# Patient Record
Sex: Female | Born: 1975 | Race: Black or African American | Hispanic: No | Marital: Single | Smoking: Never smoker
Health system: Southern US, Community
[De-identification: ages and names within clinical notes are randomized; demographics above are authoritative.]

## PROBLEM LIST (undated history)

## (undated) DIAGNOSIS — I10 Essential (primary) hypertension: Secondary | ICD-10-CM

## (undated) HISTORY — PX: ABDOMINAL HYSTERECTOMY: SHX81

---

## 2017-06-29 ENCOUNTER — Emergency Department (HOSPITAL_COMMUNITY)
Admission: EM | Admit: 2017-06-29 | Discharge: 2017-06-29 | Disposition: A | Discharge status: Home / Self Care | Payer: 59 | Attending: Emergency Medicine | Admitting: Emergency Medicine

## 2017-06-29 ENCOUNTER — Encounter (HOSPITAL_COMMUNITY): Payer: Self-pay | Admitting: Emergency Medicine

## 2017-06-29 ENCOUNTER — Other Ambulatory Visit: Payer: Self-pay

## 2017-06-29 DIAGNOSIS — M79651 Pain in right thigh: Secondary | ICD-10-CM | POA: Insufficient documentation

## 2017-06-29 DIAGNOSIS — M79604 Pain in right leg: Secondary | ICD-10-CM

## 2017-06-29 HISTORY — DX: Essential (primary) hypertension: I10

## 2017-06-29 MED ORDER — HYDROCODONE-ACETAMINOPHEN 5-325 MG PO TABS: 2.0000 | ORAL_TABLET | ORAL | 0 refills | Status: DC | PRN

## 2017-06-29 MED ORDER — DIAZEPAM 5 MG PO TABS
5.0000 mg | ORAL_TABLET | Freq: Once | ORAL | Status: AC
  Administered 2017-06-29: 5 mg via ORAL
  Filled 2017-06-29: qty 1

## 2017-06-29 MED ORDER — MORPHINE SULFATE (PF) 4 MG/ML IV SOLN
4.0000 mg | Freq: Once | INTRAVENOUS | Status: AC
  Administered 2017-06-29: 4 mg via INTRAMUSCULAR
  Filled 2017-06-29: qty 1

## 2017-06-29 MED ORDER — METAXALONE 800 MG PO TABS: 800.0000 mg | ORAL_TABLET | Freq: Three times a day (TID) | ORAL | 0 refills | Status: AC

## 2017-06-29 MED ORDER — IBUPROFEN 400 MG PO TABS: 400.0000 mg | ORAL_TABLET | Freq: Four times a day (QID) | ORAL | 0 refills | Status: DC | PRN

## 2017-06-29 MED ORDER — KETOROLAC TROMETHAMINE 60 MG/2ML IM SOLN
60.0000 mg | Freq: Once | INTRAMUSCULAR | Status: AC
  Administered 2017-06-29: 60 mg via INTRAMUSCULAR
  Filled 2017-06-29: qty 2

## 2017-06-29 NOTE — ED Notes (Signed)
Pt pacing in room and is tearful due to pain  Acuity increased due to pain level

## 2017-06-29 NOTE — ED Triage Notes (Signed)
Pt is c/o pain and numbness to her right upper leg States the pain goes up into her back  Pt states her leg keeps jumping  Denies any injury

## 2017-06-29 NOTE — ED Provider Notes (Signed)
[facility]-EMERGENCY DEPT Provider Note   CSN: [id number] Arrival date & time: 06/29/17  0442     History   Chief Complaint Chief Complaint  Patient presents with  . Leg Pain    HPI Nicole Powell is a 42 y.o. female.  42 year old female presents with 1 day history of right groin pain that started in back and radiates to her right lateral thigh.  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  States that the pain is sharp and worse with certain movements.  Denies any distal numbness or tingling to her right foot.  Patient denies any direct trauma.  Symptoms better with rest and no treatment used prior to arrival.  No prior history of DVT.      Past Medical History:  Diagnosis Date  . Hypertension     There are no active problems to display for this patient.   Past Surgical History:  Procedure Laterality Date  . ABDOMINAL HYSTERECTOMY      OB History    No data available       Home Medications    Prior to Admission medications   Medication Sig Start Date End Date Taking? Authorizing Provider  amLODipine (NORVASC) 5 MG tablet Take 5 mg by mouth daily.   Yes [provider]    Family History Family History  Problem Relation Age of Onset  . Diabetes Mother   . Hypertension Father     Social History Social History   Tobacco Use  . Smoking status: Never Smoker  . Smokeless tobacco: Never Used  Substance Use Topics  . Alcohol use: No    Frequency: Never  . Drug use: No     Allergies   Patient has no known allergies.   Review of Systems Review of Systems  All other systems reviewed and are negative.    Physical Exam Updated Vital Signs BP (!) 132/102 (BP Location: Left Arm)   Pulse 75   Temp 97.9 F (36.6 C) (Oral)   Resp 14   SpO2 94%   Physical Exam  Constitutional: She is oriented to person, place, and time. She appears well-developed and well-nourished.  Non-toxic appearance.  No distress.  HENT:  Head: Normocephalic and atraumatic.  Eyes: Conjunctivae, EOM and lids are normal. Pupils are equal, round, and reactive to light.  Neck: Normal range of motion. Neck supple. No tracheal deviation present. No thyroid mass present.  Cardiovascular: Normal rate, regular rhythm and normal heart sounds. Exam reveals no gallop.  No murmur heard. Pulmonary/Chest: Effort normal and breath sounds normal. No stridor. No respiratory distress. She has no decreased breath sounds. She has no wheezes. She has no rhonchi. She has no rales.  Abdominal: Soft. Normal appearance and bowel sounds are normal. She exhibits no distension. There is no tenderness. There is no rebound and no CVA tenderness.  Musculoskeletal: Normal range of motion. She exhibits no edema or tenderness.       Right hip: She exhibits normal strength, no swelling and no deformity.       Legs: Neurological: She is alert and oriented to person, place, and time. She has normal strength. No cranial nerve deficit or sensory deficit. GCS eye subscore is 4. GCS verbal subscore is 5. GCS motor subscore is 6.  Skin: Skin is warm and dry. No abrasion and no rash noted.  Psychiatric: She has a normal mood and affect. Her speech is normal and behavior is normal.  Nursing note and vitals reviewed.    ED Treatments / Results  Labs (all labs ordered are listed, but only abnormal results are displayed) Labs Reviewed - No data to display  EKG  EKG Interpretation None       Radiology No results found.  Procedures Procedures (including critical care time)  Medications Ordered in ED Medications  ketorolac (TORADOL) injection 60 mg (not administered)  diazepam (VALIUM) tablet 5 mg (not administered)  morphine 4 MG/ML injection 4 mg (not administered)     Initial Impression / Assessment and Plan / ED Course  I have reviewed the triage vital signs and the nursing notes.  Pertinent labs & imaging results that were  available during my care of the patient were reviewed by me and considered in my medical decision making (see chart for details).     Patient treated for pain here and feels better.  Suspect musculoskeletal etiology of her symptoms.  Low concern for DVT or compartment syndrome.  Stable for discharge  Final Clinical Impressions(s) / ED Diagnoses   Final diagnoses:  None    ED Discharge Orders    None       Lorre NickAllen, Kaeli Nichelson, MD 06/29/17 [phone]

## 2017-07-01 ENCOUNTER — Encounter (HOSPITAL_COMMUNITY): Payer: Self-pay

## 2017-07-01 ENCOUNTER — Emergency Department (HOSPITAL_COMMUNITY)
Admission: EM | Admit: 2017-07-01 | Discharge: 2017-07-01 | Disposition: A | Discharge status: Home / Self Care | Payer: 59 | Attending: Emergency Medicine | Admitting: Emergency Medicine

## 2017-07-01 ENCOUNTER — Other Ambulatory Visit: Payer: Self-pay

## 2017-07-01 ENCOUNTER — Emergency Department (HOSPITAL_BASED_OUTPATIENT_CLINIC_OR_DEPARTMENT_OTHER)
Admit: 2017-07-01 | Discharge: 2017-07-01 | Disposition: A | Discharge status: Home / Self Care | Payer: 59 | Attending: Emergency Medicine | Admitting: Emergency Medicine

## 2017-07-01 DIAGNOSIS — I10 Essential (primary) hypertension: Secondary | ICD-10-CM | POA: Diagnosis not present

## 2017-07-01 DIAGNOSIS — M79609 Pain in unspecified limb: Secondary | ICD-10-CM

## 2017-07-01 DIAGNOSIS — M79604 Pain in right leg: Secondary | ICD-10-CM | POA: Insufficient documentation

## 2017-07-01 DIAGNOSIS — Z79899 Other long term (current) drug therapy: Secondary | ICD-10-CM | POA: Insufficient documentation

## 2017-07-01 DIAGNOSIS — R030 Elevated blood-pressure reading, without diagnosis of hypertension: Secondary | ICD-10-CM | POA: Insufficient documentation

## 2017-07-01 MED ORDER — ACETAMINOPHEN 500 MG PO TABS
1000.0000 mg | ORAL_TABLET | Freq: Once | ORAL | Status: AC
  Administered 2017-07-01: 1000 mg via ORAL
  Filled 2017-07-01: qty 2

## 2017-07-01 MED ORDER — KETOROLAC TROMETHAMINE 30 MG/ML IJ SOLN
30.0000 mg | Freq: Once | INTRAMUSCULAR | Status: AC
  Administered 2017-07-01: 30 mg via INTRAMUSCULAR
  Filled 2017-07-01: qty 1

## 2017-07-01 MED ORDER — METHOCARBAMOL 500 MG PO TABS: 500.0000 mg | ORAL_TABLET | Freq: Two times a day (BID) | ORAL | 0 refills | Status: AC

## 2017-07-01 MED ORDER — PREDNISONE 20 MG PO TABS: 40.0000 mg | ORAL_TABLET | Freq: Once | ORAL | Status: DC

## 2017-07-01 MED ORDER — PREDNISONE 20 MG PO TABS: 40.0000 mg | ORAL_TABLET | Freq: Every day | ORAL | 0 refills | Status: AC

## 2017-07-01 NOTE — Progress Notes (Signed)
*  Preliminary Results* Right lower extremity venous duplex completed. Right lower extremity is negative for deep vein thrombosis. There is no evidence of right Baker's cyst.  07/01/2017 10:01 AM  Gertie FeyMichelle Ernesteen Mihalic, BS, RVT, RDCS, RDMS

## 2017-07-01 NOTE — ED Triage Notes (Signed)
Patient c/o right hip pain x 5 days. Patient was seen in the ED 2 days ago and was prescribed Hydrocodone. Patient states the pain is relly no better and her job does not allow her to work if taking Hydrocodone.

## 2017-07-01 NOTE — Discharge Instructions (Signed)
Please see the information and instructions below regarding your visit.  Your diagnoses today include:  1. Pain of right lower extremity   2. Elevated blood pressure reading    About diagnosis. Most episodes of acute low back pain are self-limited. Your exam was reassuring today that the source of your pain is not affecting the spinal cord and nerves that originate in the spinal cord.   If you have a history of disc herniation or arthritis in your spine, the nerves exiting the spine on one side get inflamed. This can cause severe pain. We call this radiculopathy. It can radiate down the leg. We do not always know what causes the sudden inflammation.  Tests performed today include: See side panel of your discharge paperwork for testing performed today. Vital signs are listed at the bottom of these instructions.   The ultrasound of your right lower extremity shows no evidence of blood clot.  Medications prescribed:    Take any prescribed medications only as prescribed, and any over the counter medications only as directed on the packaging.  You are prescribed prednisone, a steroid. This is a medication to help reduce inflammation in the spine.  Common side effects include upset stomach/nausea. You may take this medicine with food if this occurs. Other side effects include restlessness, difficulty sleeping, and increased sweating. Call your healthcare provider if these do not resolve after finishing the medication.  This medicine may increase your blood sugar so additional careful monitoring is needed of blood sugar if you have diabetes. Call your healthcare provider for any signs/symtpoms of high blood sugar such as confusion, feeling sleepy, more thirst, more hunger, passing urine more often, flushing, fast breathing, or breath that smells like fruit.  You are prescribed Robaxin, a muscle relaxant. Some common side effects of this medication include:  Feeling sleepy.  Dizziness. Take care  upon going from a seated to a standing position.  Dry mouth.  Feeling tired or weak.  Hard stools (constipation).  Upset stomach. These are not all of the side effects that may occur. If you have questions about side effects, call your doctor. Call your primary care provider for medical advice about side effects.  This medication can be sedating. Only take this medication as needed. Please do not combine with alcohol. Do not drive or operate machinery while taking this medication.   This medication can interact with some other medications. Make sure to tell any provider you are taking this medication before they prescribe you a new medication.    Home care instructions:   Low back pain gets worse the longer you stay stationary. Please keep moving and walking as tolerated. There are exercises included in this packet to perform as tolerated for your low back pain.   Apply heat to the areas that are painful. Avoid twisting or bending your trunk to lift something. Do not lift anything above 25 lbs while recovering from this flare of low back pain.  Please follow any educational materials contained in this packet.   Follow-up instructions: Please follow-up with your primary care provider as soon as possible for further evaluation of your symptoms if they are not completely improved.   Please follow up with Dr. Everardo PacificVarkey of orthopedic surgery.  Return instructions:  Please return to the Emergency Department if you experience worsening symptoms.  Please return for any fever or chills in the setting of your back pain, weakness in the muscles of the legs, numbness in your legs and feet that  is new or changing, numbness in the area where you wipe, retention of your urine, loss of bowel or bladder control, or problems with walking. Please return for any change in color of your lower extremity, increasing swelling. Please return if you have any other emergent concerns.  Additional  Information:   Your vital signs today were: BP 127/74    Pulse 74    Temp 98 F (36.7 C) (Oral)    Resp 16    Ht 5\' 5"  (1.651 m)    Wt 83.5 kg (184 lb)    SpO2 96%    BMI 30.62 kg/m  If your blood pressure (BP) was elevated on multiple readings during this visit above 130 for the top number or above 80 for the bottom number, please have this repeated by your primary care provider within one month. --------------  Thank you for allowing [place] to participate in your care today.

## 2017-07-01 NOTE — ED Notes (Signed)
Ultrasound at bedside

## 2017-07-01 NOTE — ED Provider Notes (Signed)
[facility]-EMERGENCY DEPT Provider Note   CSN: [id number] Arrival date & time: 07/01/17  0710     History   Chief Complaint Chief Complaint  Patient presents with  . Leg Pain    HPI Nicole Powell is a 42 y.o. female.  HPI  Patient is a 26-year-old female with a past medical history of hypertension presenting for right leg pain.  Patient reports that the pain has been occurring for the past 4 days.  Patient reports that it begins in her right buttocks, radiates down to the anterior thigh, and into her calf.  Patient reports she was evaluated here 2 days ago and prescribed Norco and muscle relaxants, however her employer told her she was unable to work while taking these medications.  Patient reports that she has had minimal relief of the pain.  Patient reports that her distal right lower extremity will occasionally have decreased sensation when her leg is fully flexed.  Patient denies any symptoms on the left lower extremity.  Patient denies any saddle anesthesia, loss of bowel or bladder control, or muscular weakness.  Patient but she has had difficulty picking up the leg due to the pain.  No erythema or edema of right hip.  No color change of the extremity.  Patient denies any history of cancer, IVDU.  No history of immunocompromise status.  No recent immobilization, hospitalization, history of cancer, history of DVT/PE, smoking.   Past Medical History:  Diagnosis Date  . Hypertension     There are no active problems to display for this patient.   Past Surgical History:  Procedure Laterality Date  . ABDOMINAL HYSTERECTOMY      OB History   None      Home Medications    Prior to Admission medications   Medication Sig Start Date End Date Taking? Authorizing Provider  amLODipine (NORVASC) 5 MG tablet Take 5 mg by mouth daily.   Yes [provider]  HYDROcodone-acetaminophen (NORCO/VICODIN) 5-325 MG tablet Take 2 tablets by mouth every 4 (four)  hours as needed. Patient taking differently: Take 2 tablets by mouth every 4 (four) hours as needed for severe pain.  06/29/17  Yes Lorre Nick, MD  ibuprofen (ADVIL,MOTRIN) 400 MG tablet Take 1 tablet (400 mg total) by mouth every 6 (six) hours as needed. Patient taking differently: Take 400 mg by mouth every 6 (six) hours as needed for moderate pain.  06/29/17  Yes Lorre Nick, MD  metaxalone (SKELAXIN) 800 MG tablet Take 1 tablet (800 mg total) by mouth 3 (three) times daily. 06/29/17  Yes Lorre Nick, MD    Family History Family History  Problem Relation Age of Onset  . Diabetes Mother   . Hypertension Father     Social History Social History   Tobacco Use  . Smoking status: Never Smoker  . Smokeless tobacco: Never Used  Substance Use Topics  . Alcohol use: No    Frequency: Never  . Drug use: No     Allergies   Patient has no known allergies.   Review of Systems Review of Systems  Constitutional: Negative for chills and fever.  Gastrointestinal: Negative for abdominal pain.  Genitourinary: Negative for difficulty urinating and dysuria.  Musculoskeletal: Positive for arthralgias, back pain and myalgias.  Skin: Negative for color change.  Neurological: Positive for numbness. Negative for weakness.     Physical Exam Updated Vital Signs BP (!) 150/95 (BP Location: Left Arm)   Pulse 86   Temp 98 F (  36.7 C) (Oral)   Resp 18   Ht 5\' 5"  (1.651 m)   Wt 83.5 kg (184 lb)   SpO2 97%   BMI 30.62 kg/m   Physical Exam  Constitutional: She appears well-developed and well-nourished. No distress.  Sitting comfortably in bed.  HENT:  Head: Normocephalic and atraumatic.  Eyes: Conjunctivae are normal. Right eye exhibits no discharge. Left eye exhibits no discharge.  EOMs normal to gross examination.  Neck: Normal range of motion.  Cardiovascular: Normal rate and regular rhythm.  Intact, 2+ DP and PT pulses bilaterally. No calf tenderness or calf edema.    Pulmonary/Chest:  Normal respiratory effort. Patient converses comfortably. No audible wheeze or stridor.  Abdominal: She exhibits no distension.  Musculoskeletal: Normal range of motion.  No midline tenderness to palpation of lumbar spine.  There is mild right-sided paraspinal muscular tenderness.  Right hip with pain over greater trochanter, but no erythema or edema.  LOWER EXTREMITY EXAM: Right  INSPECTION & PALPATION: No gross deformity.  No swelling.  No open wounds.  TTP of right anterior thigh and lateral leg.  SENSORY: sensation is intact to light and sharp touch in:  Superficial peroneal nerve distribution (over dorsum of foot) Deep peroneal nerve distribution (over first dorsal web space) Sural nerve distribution (over lateral aspect 5th metatarsal) Saphenous nerve distribution (over medial instep)  MOTOR:  4+/5 hip flexion, extension, adduction, abduction (tested in a seated position); patient reporting pain is affecting effort 5/5 leg flexion, extension (tested in a seated position; legs hanging over side of bed) 5/5 great toe dorsiflexion, plantar flexion 5/5 ankle dorsiflexion, plantar flexion  VASCULAR: 2+ dorsalis pedis and posterior tibialis pulses Capillary refill < 2 sec, toes warm and well-perfused  COMPARTMENTS: Soft, warm, well-perfused No pain with passive extension No parethesias   Neurological: She is alert.  Cranial nerves intact to gross observation. Patient moves extremities without difficulty.  Skin: Skin is warm and dry. She is not diaphoretic.  Psychiatric: She has a normal mood and affect. Her behavior is normal. Judgment and thought content normal.  Nursing note and vitals reviewed.    ED Treatments / Results  Labs (all labs ordered are listed, but only abnormal results are displayed) Labs Reviewed - No data to display  EKG  EKG Interpretation None       Radiology No results found.  Procedures Procedures (including  critical care time)  Medications Ordered in ED Medications  ketorolac (TORADOL) 30 MG/ML injection 30 mg (30 mg Intramuscular Given 07/01/17 0852)  acetaminophen (TYLENOL) tablet 1,000 mg (1,000 mg Oral Given 07/01/17 7829)     Initial Impression / Assessment and Plan / ED Course  I have reviewed the triage vital signs and the nursing notes.  Pertinent labs & imaging results that were available during my care of the patient were reviewed by me and considered in my medical decision making (see chart for details).  Clinical Course as of Jul 01 1229  Fri Jul 01, 2017  1031 Negative DVT noticed.    [AM]    Clinical Course User Index [AM] Elisha Ponder, PA-C    Patient is nontoxic-appearing, afebrile, in no acute distress.  Differential diagnosis includes lumbar radiculopathy, trochanteric bursitis, issue bursitis, DVT of the right lower extremity.  Doubt arterial occlusion, as limb is well perfused, and patient has 2+ distal pulses.  No evidence of cauda equina at this time.  Lower extremity venous ultrasound study is negative for DVT.  Suspect radiculopathy, particularly L4 radiculopathy  given the distribution of patient's symptoms.  I discussed safe exercises and lifting practices.  Will prescribe steroid prescription and refill muscle relaxant.  Discussed orthopedic and PCP follow-up.  Patient is in understanding agrees with plan of care.  Of note, patient is hypertensive today.  Patient is taking antihypertensives, but did not take this morning.  Discussed with patient.  Final Clinical Impressions(s) / ED Diagnoses   Final diagnoses:  Pain of right lower extremity  Elevated blood pressure reading    ED Discharge Orders        Ordered    predniSONE (DELTASONE) 20 MG tablet  Daily     07/01/17 1235    methocarbamol (ROBAXIN) 500 MG tablet  2 times daily     07/01/17 1235       Delia ChimesMurray, Nekisha Mcdiarmid B, PA-C 07/01/17 1255    Benjiman CorePickering, Nathan, MD 07/01/17 1708

## 2018-06-26 ENCOUNTER — Emergency Department (HOSPITAL_COMMUNITY)
Admission: EM | Admit: 2018-06-26 | Discharge: 2018-06-26 | Disposition: A | Discharge status: Home / Self Care | Payer: 59 | Attending: Emergency Medicine | Admitting: Emergency Medicine

## 2018-06-26 ENCOUNTER — Encounter (HOSPITAL_COMMUNITY): Payer: Self-pay | Admitting: Emergency Medicine

## 2018-06-26 ENCOUNTER — Other Ambulatory Visit: Payer: Self-pay

## 2018-06-26 DIAGNOSIS — R2241 Localized swelling, mass and lump, right lower limb: Secondary | ICD-10-CM | POA: Insufficient documentation

## 2018-06-26 DIAGNOSIS — M25471 Effusion, right ankle: Secondary | ICD-10-CM

## 2018-06-26 DIAGNOSIS — M25571 Pain in right ankle and joints of right foot: Secondary | ICD-10-CM | POA: Diagnosis present

## 2018-06-26 DIAGNOSIS — F1210 Cannabis abuse, uncomplicated: Secondary | ICD-10-CM | POA: Insufficient documentation

## 2018-06-26 DIAGNOSIS — F17200 Nicotine dependence, unspecified, uncomplicated: Secondary | ICD-10-CM | POA: Diagnosis not present

## 2018-06-26 NOTE — Discharge Instructions (Addendum)
Please elevate, use ace for compression, and cold therapy.

## 2018-06-26 NOTE — ED Provider Notes (Signed)
HPI Nicole Powell is a 43 y.o. female.     HPI  43 yo femlale ho hypertension presents today complaining of swelling to right ankle.  States that it began approximately a week ago in the lateral aspect.  She has had increased swelling around to the back.  She has not had any injury, redness, or fever.  She denies any history of DVT or PE, immobilization.  She has no personal history of gout although her grandmother had gout.  Able to walk on it but has had ongoing pain and increased swelling  History reviewed. No pertinent past medical history.  Patient Active Problem List   Diagnosis Date Noted  . OBESITY 07/06/2007    History reviewed. No pertinent surgical history.      Home Medications    Prior to Admission medications   Not on File    Family History No family history on file.  Social History Social History   Tobacco Use  . Smoking status: Current Some Day Smoker  . Smokeless tobacco: Never Used  Substance Use Topics  . Alcohol use: Yes  . Drug use: Yes    Types: Marijuana     Allergies   Patient has no known allergies.   Review of Systems Review of Systems  All other systems reviewed and are negative.    Physical Exam Updated Vital Signs BP (!) 155/92 (BP Location: Right Arm)   Pulse 62   Temp 98.3 F (36.8 C) (Oral)   Resp 17   Ht 1.829 m (6')   Wt 127 kg   SpO2 99%   BMI 37.97 kg/m   Physical Exam Vitals signs and nursing note reviewed.  Constitutional:      Appearance: He is obese.  HENT:     Head: Normocephalic.     Right Ear: External ear normal.     Left Ear: External ear normal.     Nose: Nose normal.  Eyes:     Pupils: Pupils are equal, round, and reactive to light.  Cardiovascular:     Rate and Rhythm: Normal rate.  Musculoskeletal: Normal range of motion.       Feet:     Comments: Swelling - no erythema or warmth No point ttp  Neurological:     Mental Status: He is alert.      ED Treatments / Results  Labs  (all labs ordered are listed, but only abnormal results are displayed) Labs Reviewed - No data to display  EKG None  Radiology No results found.  Procedures Procedures (including critical care time)  Medications Ordered in ED Medications - No data to display   Initial Impression / Assessment and Plan / ED Course  I have reviewed the triage vital signs and the nursing notes.  Pertinent labs & imaging results that were available during my care of the patient were reviewed by me and considered in my medical decision making (see chart for details).        Patient with right ankle swelling and pain.  I do not think this is consistent with DVT as it seems localized to the ankle.  She has no known injury and not think imaging is indicated.  Suspect that she has some DJD with localized swelling.  Plan rest, ice, compression, and elevation.  Will refer to Dr. Pearletha Forge for outpatient follow-up  DDX Right ankle swelling   Margarita Grizzle, MD 06/26/18 [place] Fruit

## 2018-06-26 NOTE — ED Triage Notes (Signed)
Pt c/o pain in right ankle x's 4-5 days with no known injury.Also c/o swelling to ankle

## 2021-07-15 ENCOUNTER — Emergency Department (HOSPITAL_COMMUNITY): Payer: 59

## 2021-07-15 ENCOUNTER — Emergency Department (HOSPITAL_COMMUNITY)
Admission: EM | Admit: 2021-07-15 | Discharge: 2021-07-16 | Disposition: A | Discharge status: Home / Self Care | Payer: 59 | Attending: Emergency Medicine | Admitting: Emergency Medicine

## 2021-07-15 ENCOUNTER — Encounter (HOSPITAL_COMMUNITY): Payer: Self-pay | Admitting: Emergency Medicine

## 2021-07-15 ENCOUNTER — Other Ambulatory Visit: Payer: Self-pay

## 2021-07-15 DIAGNOSIS — I10 Essential (primary) hypertension: Secondary | ICD-10-CM | POA: Insufficient documentation

## 2021-07-15 DIAGNOSIS — M25522 Pain in left elbow: Secondary | ICD-10-CM | POA: Diagnosis not present

## 2021-07-15 DIAGNOSIS — Z79899 Other long term (current) drug therapy: Secondary | ICD-10-CM | POA: Insufficient documentation

## 2021-07-15 DIAGNOSIS — R519 Headache, unspecified: Secondary | ICD-10-CM | POA: Diagnosis not present

## 2021-07-15 DIAGNOSIS — Y92410 Unspecified street and highway as the place of occurrence of the external cause: Secondary | ICD-10-CM | POA: Diagnosis not present

## 2021-07-15 DIAGNOSIS — M25512 Pain in left shoulder: Secondary | ICD-10-CM | POA: Insufficient documentation
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
## 2021-07-16 MED ORDER — MELOXICAM 15 MG PO TABS: 15.0000 mg | ORAL_TABLET | Freq: Every day | ORAL | 0 refills | Status: AC

## 2021-07-16 MED ORDER — KETOROLAC TROMETHAMINE 60 MG/2ML IM SOLN
15.0000 mg | Freq: Once | INTRAMUSCULAR | Status: AC
  Administered 2021-07-16: 15 mg via INTRAMUSCULAR
  Filled 2021-07-16: qty 2

## 2021-07-16 MED ORDER — OXYCODONE-ACETAMINOPHEN 5-325 MG PO TABS
2.0000 | ORAL_TABLET | Freq: Once | ORAL | Status: AC
  Administered 2021-07-16: 2 via ORAL
  Filled 2021-07-16: qty 2
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
## 2022-11-25 ENCOUNTER — Emergency Department
Admission: EM | Admit: 2022-11-25 | Discharge: 2022-11-25 | Disposition: A | Discharge status: Home / Self Care | Payer: Self-pay | Attending: Emergency Medicine | Admitting: Emergency Medicine

## 2022-11-25 ENCOUNTER — Other Ambulatory Visit: Payer: Self-pay

## 2022-11-25 ENCOUNTER — Emergency Department: Payer: Self-pay

## 2022-11-25 DIAGNOSIS — I10 Essential (primary) hypertension: Secondary | ICD-10-CM | POA: Insufficient documentation

## 2022-11-25 DIAGNOSIS — R0789 Other chest pain: Secondary | ICD-10-CM | POA: Insufficient documentation

## 2022-11-25 DIAGNOSIS — R519 Headache, unspecified: Secondary | ICD-10-CM | POA: Insufficient documentation

## 2022-11-25 DIAGNOSIS — R079 Chest pain, unspecified: Secondary | ICD-10-CM

## 2022-11-25 LAB — COMPREHENSIVE METABOLIC PANEL
ALT: 32 U/L (ref 0–44)
AST: 30 U/L (ref 15–41)
Albumin: 4.4 g/dL (ref 3.5–5.0)
Alkaline Phosphatase: 84 U/L (ref 38–126)
Anion gap: 9 (ref 5–15)
BUN: 18 mg/dL (ref 6–20)
CO2: 23 mmol/L (ref 22–32)
Calcium: 9.9 mg/dL (ref 8.9–10.3)
Chloride: 103 mmol/L (ref 98–111)
Creatinine, Ser: 0.79 mg/dL (ref 0.44–1.00)
GFR, Estimated: >60 mL/min (ref >60)
Glucose, Bld: 118 mg/dL — ABNORMAL HIGH (ref 70–99)
Potassium: 4.5 mmol/L (ref 3.5–5.1)
Sodium: 135 mmol/L (ref 135–145)
Total Bilirubin: 0.3 mg/dL (ref 0.3–1.2)
Total Protein: 8.3 g/dL — ABNORMAL HIGH (ref 6.5–8.1)

## 2022-11-25 LAB — CBC WITH DIFFERENTIAL/PLATELET
Abs Immature Granulocytes: 0.02 10*3/uL (ref 0.00–0.07)
Basophils Absolute: 0 10*3/uL (ref 0.0–0.1)
Basophils Relative: 1 %
Eosinophils Absolute: 0.1 10*3/uL (ref 0.0–0.5)
Eosinophils Relative: 2 %
HCT: 44.9 % (ref 36.0–46.0)
Hemoglobin: 14.2 g/dL (ref 12.0–15.0)
Immature Granulocytes: 0 %
Lymphocytes Relative: 36 %
Lymphs Abs: 2 10*3/uL (ref 0.7–4.0)
MCH: 25.7 pg — ABNORMAL LOW (ref 26.0–34.0)
MCHC: 31.6 g/dL (ref 30.0–36.0)
MCV: 81.3 fL (ref 80.0–100.0)
Monocytes Absolute: 0.4 10*3/uL (ref 0.1–1.0)
Monocytes Relative: 7 %
Neutro Abs: 3 10*3/uL (ref 1.7–7.7)
Neutrophils Relative %: 54 %
Platelets: 246 10*3/uL (ref 150–400)
RBC: 5.52 MIL/uL — ABNORMAL HIGH (ref 3.87–5.11)
RDW: 12.7 % (ref 11.5–15.5)
WBC: 5.5 10*3/uL (ref 4.0–10.5)
nRBC: 0 % (ref 0.0–0.2)

## 2022-11-25 LAB — TROPONIN I (HIGH SENSITIVITY)
Troponin I (High Sensitivity): 6 ng/L (ref <18)
Troponin I (High Sensitivity): 6 ng/L (ref <18)

## 2022-11-25 MED ORDER — DIPHENHYDRAMINE HCL 50 MG/ML IJ SOLN
25.0000 mg | Freq: Once | INTRAMUSCULAR | Status: AC
  Administered 2022-11-25: 25 mg via INTRAVENOUS
  Filled 2022-11-25: qty 1

## 2022-11-25 MED ORDER — KETOROLAC TROMETHAMINE 15 MG/ML IJ SOLN
15.0000 mg | Freq: Once | INTRAMUSCULAR | Status: AC
  Administered 2022-11-25: 15 mg via INTRAVENOUS
  Filled 2022-11-25: qty 1

## 2022-11-25 MED ORDER — ACETAMINOPHEN 500 MG PO TABS
1000.0000 mg | ORAL_TABLET | Freq: Once | ORAL | Status: AC
  Administered 2022-11-25: 1000 mg via ORAL
  Filled 2022-11-25: qty 2

## 2022-11-25 MED ORDER — PROCHLORPERAZINE EDISYLATE 10 MG/2ML IJ SOLN
10.0000 mg | Freq: Once | INTRAMUSCULAR | Status: AC
  Administered 2022-11-25: 10 mg via INTRAVENOUS
  Filled 2022-11-25: qty 2

## 2022-11-25 MED ORDER — AMLODIPINE BESYLATE 5 MG PO TABS: 10.0000 mg | ORAL_TABLET | Freq: Every day | ORAL | 2 refills | Status: AC

## 2022-11-25 MED ORDER — SODIUM CHLORIDE 0.9 % IV BOLUS
1000.0000 mL | Freq: Once | INTRAVENOUS | Status: AC
  Administered 2022-11-25: 1000 mL via INTRAVENOUS

## 2022-11-25 NOTE — ED Triage Notes (Signed)
Pt arrives via [facility] from work. Pt c/o HTN and CP x1 week. Per EMS BP 180s systolic and inverted T-waves. Pt reports compliance with medication. Pt received 324mg  ASA and 1 nitroglycerin spray with decrease in BP but no relief in CP. CBG 138 per EMS. Pt axox4 upon arrival and c/o persistent 8/10 midsternal CP

## 2022-11-25 NOTE — Discharge Instructions (Signed)
You were seen the emergency department today for your elevated blood pressure with chest pain and headache.  Fortunately your testing was reassuring.  I have sent a prescription for an increased dose of your amlodipine to your pharmacy.  Please take this as directed.  Please arrange follow-up with primary care doctor for further evaluation.  Return to the ER for new or worsening symptoms.

## 2022-11-25 NOTE — ED Provider Notes (Signed)
[facility] Provider Note    Event Date/Time   First MD Initiated Contact with Patient 11/25/22 [phone]     (approximate)   History   Hypertension and Chest Pain   HPI  Nicole Powell is a 47 y.o. female with history of hypertension presenting to the emergency department for evaluation of elevated blood pressure.  Patient reports has been compliant with her hypertension medication, but has noted elevated blood pressures over the last week.  She additionally reports intermittent chest pain described as a substernal pain, nonradiating.  Received aspirin and nitro with EMS without significant change.  Additionally reports she is having intermittent headache, not severe in onset or different than prior described as a pain behind her bilateral eyes.  No associated numbness, tingling, double vision.      Physical Exam   Triage Vital Signs: ED Triage Vitals  Encounter Vitals Group     BP 11/25/22 0714 (!) 162/117     Systolic BP Percentile --      Diastolic BP Percentile --      Pulse Rate 11/25/22 0714 72     Resp 11/25/22 0714 19     Temp 11/25/22 0718 97.6 F (36.4 C)     Temp Source 11/25/22 0718 Oral     SpO2 11/25/22 0714 100 %     Weight 11/25/22 0713 221 lb 9 oz (100.5 kg)     Height 11/25/22 0713 5\' 5"  (1.651 m)     Head Circumference --      Peak Flow --      Pain Score 11/25/22 0713 8     Pain Loc --      Pain Education --      Exclude from Growth Chart --     Most recent vital signs: Vitals:   11/25/22 0930 11/25/22 1033  BP: (!) 137/90 (!) 148/93  Pulse: 68   Resp: (!) 24   Temp:    SpO2:       General: Awake, interactive  CV:  Regular rate, good peripheral perfusion.  Resp:  Lungs clear, unlabored respirations.  Abd:  Soft, nondistended.  Neuro:  Alert and oriented, normal extraocular movements, symmetric facial movement, sensation intact over bilateral upper and lower extremities with 5 out of 5 strength.  Normal finger-to-nose  testing.   ED Results / Procedures / Treatments   Labs (all labs ordered are listed, but only abnormal results are displayed) Labs Reviewed  CBC WITH DIFFERENTIAL/PLATELET - Abnormal; Notable for the following components:      Result Value   RBC 5.52 (*)    MCH 25.7 (*)    All other components within normal limits  COMPREHENSIVE METABOLIC PANEL - Abnormal; Notable for the following components:   Glucose, Bld 118 (*)    Total Protein 8.3 (*)    All other components within normal limits  TROPONIN I (HIGH SENSITIVITY)  TROPONIN I (HIGH SENSITIVITY)     EKG EKG independently reviewed interpreted by myself (ER attending) demonstrates:  EKG demonstrates sinus rhythm at a rate of 63, PR 151, QRS 77, QTc 352, T wave flattening in multiple leads  RADIOLOGY Imaging independently reviewed and interpreted by myself demonstrates:  CT head without acute bleed  PROCEDURES:  Critical Care performed: No  Procedures   MEDICATIONS ORDERED IN ED: Medications  sodium chloride 0.9 % bolus 1,000 mL (0 mLs Intravenous Stopped 11/25/22 1033)  diphenhydrAMINE (BENADRYL) injection 25 mg (25 mg Intravenous Given 11/25/22 0742)  prochlorperazine (COMPAZINE)  injection 10 mg (10 mg Intravenous Given 11/25/22 0742)  acetaminophen (TYLENOL) tablet 1,000 mg (1,000 mg Oral Given 11/25/22 0741)  ketorolac (TORADOL) 15 MG/ML injection 15 mg (15 mg Intravenous Given 11/25/22 0836)     IMPRESSION / MDM / ASSESSMENT AND PLAN / ED COURSE  I reviewed the triage vital signs and the nursing notes.  Differential diagnosis includes, but is not limited to, ACS, pneumonia, pneumothorax, low risk PE and PERC negative, hypertensive emergency including intracranial bleed, cardiac strain, pulmonary edema  Patient's presentation is most consistent with acute presentation with potential threat to life or bodily function.  47 year old female presenting to the emergency department for evaluation of elevated blood pressure  with associated chest pain and headache.  Will obtain labs, EKG, chest x-[place], head CT.  Headache cocktail ordered, will hold on NSAIDs until results of head CT.  Head CT without acute bleed.  Lab work reassuring.  Patient reevaluated.  Does report feeling significantly improved.  She is comfortable with discharge home.  Strict return precautions provided.  Is trying to reestablish care with a primary care doctor, so will DC with prescription for increased amlodipine until she is able to arrange follow-up.     FINAL CLINICAL IMPRESSION(S) / ED DIAGNOSES   Final diagnoses:  Nonspecific chest pain  Acute nonintractable headache, unspecified headache type  Uncontrolled hypertension     Rx / DC Orders   ED Discharge Orders          Ordered    amLODipine (NORVASC) 5 MG tablet  Daily        11/25/22 0938             Note:  This document was prepared using Dragon voice recognition software and may include unintentional dictation errors.   Trinna Post, MD 11/25/22 [phone]
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
## 2023-06-02 ENCOUNTER — Emergency Department (HOSPITAL_BASED_OUTPATIENT_CLINIC_OR_DEPARTMENT_OTHER)
Admission: EM | Admit: 2023-06-02 | Discharge: 2023-06-02 | Disposition: A | Discharge status: Home / Self Care | Payer: Self-pay | Attending: Emergency Medicine | Admitting: Emergency Medicine

## 2023-06-02 ENCOUNTER — Encounter (HOSPITAL_BASED_OUTPATIENT_CLINIC_OR_DEPARTMENT_OTHER): Payer: Self-pay

## 2023-06-02 ENCOUNTER — Emergency Department (HOSPITAL_BASED_OUTPATIENT_CLINIC_OR_DEPARTMENT_OTHER): Payer: Self-pay

## 2023-06-02 ENCOUNTER — Other Ambulatory Visit: Payer: Self-pay

## 2023-06-02 DIAGNOSIS — S60221A Contusion of right hand, initial encounter: Secondary | ICD-10-CM | POA: Diagnosis not present

## 2023-06-02 DIAGNOSIS — Y92410 Unspecified street and highway as the place of occurrence of the external cause: Secondary | ICD-10-CM | POA: Diagnosis not present

## 2023-06-02 DIAGNOSIS — S6991XA Unspecified injury of right wrist, hand and finger(s), initial encounter: Secondary | ICD-10-CM | POA: Diagnosis present

## 2023-06-02 MED ORDER — IBUPROFEN 600 MG PO TABS
600.0000 mg | ORAL_TABLET | Freq: Four times a day (QID) | ORAL | 0 refills | Status: AC | PRN
Start: 2023-06-02 — End: ?

## 2023-06-02 MED ORDER — HYDROCODONE-ACETAMINOPHEN 5-325 MG PO TABS: 1.0000 | ORAL_TABLET | ORAL | 0 refills | Status: AC | PRN

## 2023-06-02 MED ORDER — HYDROCODONE-ACETAMINOPHEN 5-325 MG PO TABS
1.0000 | ORAL_TABLET | Freq: Once | ORAL | Status: AC
  Administered 2023-06-02: 1 via ORAL
  Filled 2023-06-02: qty 1

## 2023-06-02 MED ORDER — IBUPROFEN 400 MG PO TABS
600.0000 mg | ORAL_TABLET | Freq: Once | ORAL | Status: AC
  Administered 2023-06-02: 600 mg via ORAL
  Filled 2023-06-02: qty 1
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
## 2023-06-02 NOTE — Discharge Instructions (Addendum)
As discussed, your hand xray does not show any broken bones and your exam does not show any tendon injury. Take ibuprofen every 6 hours as prescribed, and Norco for as if needed.   If hand pain and swelling are no better in 3-4 days, you can follow up with Dr. Kerry Fort (hand specialist) for repeat examination.
